# Patient Record
Sex: Male | Born: 2005 | Race: Black or African American | Hispanic: No | Marital: Single | Smoking: Never smoker
Health system: Southern US, Community
[De-identification: ages and names within clinical notes are randomized; demographics above are authoritative.]

## PROBLEM LIST (undated history)

## (undated) DIAGNOSIS — G4700 Insomnia, unspecified: Secondary | ICD-10-CM

## (undated) DIAGNOSIS — F840 Autistic disorder: Secondary | ICD-10-CM

## (undated) DIAGNOSIS — F419 Anxiety disorder, unspecified: Secondary | ICD-10-CM

## (undated) HISTORY — DX: Insomnia, unspecified: G47.00

## (undated) HISTORY — DX: Anxiety disorder, unspecified: F41.9

## (undated) HISTORY — DX: Autistic disorder: F84.0

---
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
## 2024-03-16 ENCOUNTER — Ambulatory Visit: Payer: MEDICAID | Admitting: Pediatrics

## 2024-03-16 DIAGNOSIS — Z113 Encounter for screening for infections with a predominantly sexual mode of transmission: Secondary | ICD-10-CM

## 2024-03-25 ENCOUNTER — Ambulatory Visit: Payer: MEDICAID | Admitting: Pediatrics

## 2024-03-25 DIAGNOSIS — Z113 Encounter for screening for infections with a predominantly sexual mode of transmission: Secondary | ICD-10-CM
